# Patient Record
Sex: Female | Born: 1995 | Race: White | Hispanic: No | Marital: Single | Smoking: Never smoker
Health system: Southern US, Community
[De-identification: ages and names within clinical notes are randomized; demographics above are authoritative.]

## PROBLEM LIST (undated history)

## (undated) DIAGNOSIS — F329 Major depressive disorder, single episode, unspecified: Secondary | ICD-10-CM

## (undated) DIAGNOSIS — F32A Depression, unspecified: Secondary | ICD-10-CM

## (undated) HISTORY — PX: ANKLE SURGERY: SHX546

---

## 2014-09-20 ENCOUNTER — Emergency Department: Payer: Self-pay | Admitting: Emergency Medicine

## 2014-09-21 LAB — BASIC METABOLIC PANEL
Anion Gap: 7 (ref 7–16)
BUN: 14 mg/dL (ref 7–18)
CREATININE: 1.03 mg/dL (ref 0.60–1.30)
Calcium, Total: 9.5 mg/dL (ref 9.0–10.7)
Chloride: 105 mmol/L (ref 98–107)
Co2: 28 mmol/L (ref 21–32)
EGFR (African American): >60
EGFR (Non-African Amer.): >60
GLUCOSE: 94 mg/dL (ref 65–99)
Osmolality: 280 (ref 275–301)
Potassium: 3.7 mmol/L (ref 3.5–5.1)
SODIUM: 140 mmol/L (ref 136–145)

## 2014-09-21 LAB — CBC
HCT: 42.2 % (ref 35.0–47.0)
HGB: 14 g/dL (ref 12.0–16.0)
MCH: 27.4 pg (ref 26.0–34.0)
MCHC: 33.1 g/dL (ref 32.0–36.0)
MCV: 83 fL (ref 80–100)
Platelet: 385 10*3/uL (ref 150–440)
RBC: 5.1 10*6/uL (ref 3.80–5.20)
RDW: 13.1 % (ref 11.5–14.5)
WBC: 9.4 10*3/uL (ref 3.6–11.0)

## 2015-12-26 ENCOUNTER — Encounter: Payer: Self-pay | Admitting: Emergency Medicine

## 2015-12-26 ENCOUNTER — Emergency Department
Admission: EM | Admit: 2015-12-26 | Discharge: 2015-12-27 | Disposition: A | Discharge status: Home / Self Care | Payer: BLUE CROSS/BLUE SHIELD | Attending: Emergency Medicine | Admitting: Emergency Medicine

## 2015-12-26 DIAGNOSIS — R112 Nausea with vomiting, unspecified: Secondary | ICD-10-CM | POA: Insufficient documentation

## 2015-12-26 DIAGNOSIS — R109 Unspecified abdominal pain: Secondary | ICD-10-CM | POA: Diagnosis not present

## 2015-12-26 DIAGNOSIS — Z79899 Other long term (current) drug therapy: Secondary | ICD-10-CM | POA: Insufficient documentation

## 2015-12-26 DIAGNOSIS — R197 Diarrhea, unspecified: Secondary | ICD-10-CM | POA: Diagnosis not present

## 2015-12-26 DIAGNOSIS — R0602 Shortness of breath: Secondary | ICD-10-CM | POA: Insufficient documentation

## 2015-12-26 DIAGNOSIS — Z792 Long term (current) use of antibiotics: Secondary | ICD-10-CM | POA: Insufficient documentation

## 2015-12-26 DIAGNOSIS — R1110 Vomiting, unspecified: Secondary | ICD-10-CM

## 2015-12-26 DIAGNOSIS — R42 Dizziness and giddiness: Secondary | ICD-10-CM | POA: Insufficient documentation

## 2015-12-26 DIAGNOSIS — R55 Syncope and collapse: Secondary | ICD-10-CM | POA: Diagnosis present

## 2015-12-26 DIAGNOSIS — E860 Dehydration: Secondary | ICD-10-CM | POA: Diagnosis not present

## 2015-12-26 DIAGNOSIS — R000 Tachycardia, unspecified: Secondary | ICD-10-CM | POA: Diagnosis not present

## 2015-12-26 DIAGNOSIS — Z3202 Encounter for pregnancy test, result negative: Secondary | ICD-10-CM | POA: Diagnosis not present

## 2015-12-26 LAB — COMPREHENSIVE METABOLIC PANEL
ALK PHOS: 66 U/L (ref 38–126)
ALT: 43 U/L (ref 14–54)
ANION GAP: 9 (ref 5–15)
AST: 27 U/L (ref 15–41)
Albumin: 4.5 g/dL (ref 3.5–5.0)
BILIRUBIN TOTAL: 1.1 mg/dL (ref 0.3–1.2)
BUN: 13 mg/dL (ref 6–20)
CALCIUM: 9.7 mg/dL (ref 8.9–10.3)
CO2: 26 mmol/L (ref 22–32)
CREATININE: 0.83 mg/dL (ref 0.44–1.00)
Chloride: 102 mmol/L (ref 101–111)
GFR calc non Af Amer: >60 mL/min (ref >60)
Glucose, Bld: 106 mg/dL — ABNORMAL HIGH (ref 65–99)
Potassium: 3.9 mmol/L (ref 3.5–5.1)
Sodium: 137 mmol/L (ref 135–145)
TOTAL PROTEIN: 7.9 g/dL (ref 6.5–8.1)

## 2015-12-26 LAB — CBC
HCT: 43.2 % (ref 35.0–47.0)
HEMOGLOBIN: 14.2 g/dL (ref 12.0–16.0)
MCH: 26.1 pg (ref 26.0–34.0)
MCHC: 32.8 g/dL (ref 32.0–36.0)
MCV: 79.6 fL — ABNORMAL LOW (ref 80.0–100.0)
PLATELETS: 381 10*3/uL (ref 150–440)
RBC: 5.42 MIL/uL — AB (ref 3.80–5.20)
RDW: 14.5 % (ref 11.5–14.5)
WBC: 12.4 10*3/uL — ABNORMAL HIGH (ref 3.6–11.0)

## 2015-12-26 LAB — LIPASE, BLOOD: Lipase: 20 U/L (ref 11–51)

## 2015-12-26 MED ORDER — ONDANSETRON 4 MG PO TBDP
4.0000 mg | ORAL_TABLET | Freq: Once | ORAL | Status: AC | PRN
  Administered 2015-12-26: 4 mg via ORAL
  Filled 2015-12-26: qty 1

## 2015-12-26 MED ORDER — SODIUM CHLORIDE 0.9 % IV BOLUS (SEPSIS)
1000.0000 mL | Freq: Once | INTRAVENOUS | Status: AC
  Administered 2015-12-26: 1000 mL via INTRAVENOUS

## 2015-12-26 NOTE — ED Notes (Signed)
Pt presents to ED with frequent vomiting and diarrhea for the past 24 hours with generalized abd "discomfort". Pt states the last time she went to the bathroom approx 1 hour ago to vomit she had a syncopal episode. Reports hitting her head on the toilet. Pt is alert and calm at this time. Pt reports she still feels dizzy. Has been unable to keep down any solids or fluids today.

## 2015-12-26 NOTE — ED Notes (Signed)
Pt reports low abd pain with n/v/d since last night.  No vag bleeding.  No dysuria.  Pt states vomited x 5 today.  Decreased appetite today.  Pt alert. Speech clear.

## 2015-12-27 ENCOUNTER — Emergency Department: Payer: BLUE CROSS/BLUE SHIELD

## 2015-12-27 LAB — URINALYSIS COMPLETE WITH MICROSCOPIC (ARMC ONLY)
BILIRUBIN URINE: NEGATIVE
Glucose, UA: NEGATIVE mg/dL
HGB URINE DIPSTICK: NEGATIVE
Nitrite: NEGATIVE
PH: 6 (ref 5.0–8.0)
Protein, ur: 30 mg/dL — AB
SPECIFIC GRAVITY, URINE: 1.03 (ref 1.005–1.030)

## 2015-12-27 LAB — POCT PREGNANCY, URINE: Preg Test, Ur: NEGATIVE

## 2015-12-27 MED ORDER — ONDANSETRON 4 MG PO TBDP: 4.0000 mg | ORAL_TABLET | Freq: Three times a day (TID) | ORAL | Status: AC | PRN

## 2015-12-27 NOTE — ED Notes (Signed)
Pt verbalizes understanding of discharge instructions.

## 2015-12-27 NOTE — ED Notes (Signed)
Pt return from ct scan 

## 2015-12-27 NOTE — Discharge Instructions (Signed)
Diarrhea °Diarrhea is frequent loose and watery bowel movements. It can cause you to feel weak and dehydrated. Dehydration can cause you to become tired and thirsty, have a dry mouth, and have decreased urination that often is dark yellow. Diarrhea is a sign of another problem, most often an infection that will not last long. In most cases, diarrhea typically lasts 2-3 days. However, it can last longer if it is a sign of something more serious. It is important to treat your diarrhea as directed by your caregiver to lessen or prevent future episodes of diarrhea. °CAUSES  °Some common causes include: °· Gastrointestinal infections caused by viruses, bacteria, or parasites. °· Food poisoning or food allergies. °· Certain medicines, such as antibiotics, chemotherapy, and laxatives. °· Artificial sweeteners and fructose. °· Digestive disorders. °HOME CARE INSTRUCTIONS °· Ensure adequate fluid intake (hydration): Have 1 cup (8 oz) of fluid for each diarrhea episode. Avoid fluids that contain simple sugars or sports drinks, fruit juices, whole milk products, and sodas. Your urine should be clear or pale yellow if you are drinking enough fluids. Hydrate with an oral rehydration solution that you can purchase at pharmacies, retail stores, and online. You can prepare an oral rehydration solution at home by mixing the following ingredients together: °·  - tsp table salt. °· ¾ tsp baking soda. °·  tsp salt substitute containing potassium chloride. °· 1  tablespoons sugar. °· 1 L (34 oz) of water. °· Certain foods and beverages may increase the speed at which food moves through the gastrointestinal (GI) tract. These foods and beverages should be avoided and include: °· Caffeinated and alcoholic beverages. °· High-fiber foods, such as raw fruits and vegetables, nuts, seeds, and whole grain breads and cereals. °· Foods and beverages sweetened with sugar alcohols, such as xylitol, sorbitol, and mannitol. °· Some foods may be well  tolerated and may help thicken stool including: °· Starchy foods, such as rice, toast, pasta, low-sugar cereal, oatmeal, grits, baked potatoes, crackers, and bagels. °· Bananas. °· Applesauce. °· Add probiotic-rich foods to help increase healthy bacteria in the GI tract, such as yogurt and fermented milk products. °· Wash your hands well after each diarrhea episode. °· Only take over-the-counter or prescription medicines as directed by your caregiver. °· Take a warm bath to relieve any burning or pain from frequent diarrhea episodes. °SEEK IMMEDIATE MEDICAL CARE IF:  °· You are unable to keep fluids down. °· You have persistent vomiting. °· You have blood in your stool, or your stools are black and tarry. °· You do not urinate in 6-8 hours, or there is only a small amount of very dark urine. °· You have abdominal pain that increases or localizes. °· You have weakness, dizziness, confusion, or light-headedness. °· You have a severe headache. °· Your diarrhea gets worse or does not get better. °· You have a fever or persistent symptoms for more than 2-3 days. °· You have a fever and your symptoms suddenly get worse. °MAKE SURE YOU:  °· Understand these instructions. °· Will watch your condition. °· Will get help right away if you are not doing well or get worse. °  °This information is not intended to replace advice given to you by your health care provider. Make sure you discuss any questions you have with your health care provider. °  °Document Released: 09/28/2002 Document Revised: 10/29/2014 Document Reviewed: 06/15/2012 °Elsevier Interactive Patient Education ©2016 Else[employer] Inc[employer] ° °Nausea and Vomiting °Nausea is a sick feeling that often comes   before throwing up (vomiting). Vomiting is a reflex where stomach contents come out of your mouth. Vomiting can cause severe loss of body fluids (dehydration). Children and elderly adults can become dehydrated quickly, especially if they also have diarrhea. Nausea and  vomiting are symptoms of a condition or disease. It is important to find the cause of your symptoms. CAUSES   Direct irritation of the stomach lining. This irritation can result from increased acid production (gastroesophageal reflux disease), infection, food poisoning, taking certain medicines (such as nonsteroidal anti-inflammatory drugs), alcohol use, or tobacco use.  Signals from the brain.These signals could be caused by a headache, heat exposure, an inner ear disturbance, increased pressure in the brain from injury, infection, a tumor, or a concussion, pain, emotional stimulus, or metabolic problems.  An obstruction in the gastrointestinal tract (bowel obstruction).  Illnesses such as diabetes, hepatitis, gallbladder problems, appendicitis, kidney problems, cancer, sepsis, atypical symptoms of a heart attack, or eating disorders.  Medical treatments such as chemotherapy and radiation.  Receiving medicine that makes you sleep (general anesthetic) during surgery. DIAGNOSIS Your caregiver may ask for tests to be done if the problems do not improve after a few days. Tests may also be done if symptoms are severe or if the reason for the nausea and vomiting is not clear. Tests may include:  Urine tests.  Blood tests.  Stool tests.  Cultures (to look for evidence of infection).  X-rays or other imaging studies. Test results can help your caregiver make decisions about treatment or the need for additional tests. TREATMENT You need to stay well hydrated. Drink frequently but in small amounts.You may wish to drink water, sports drinks, clear broth, or eat frozen ice pops or gelatin dessert to help stay hydrated.When you eat, eating slowly may help prevent nausea.There are also some antinausea medicines that may help prevent nausea. HOME CARE INSTRUCTIONS   Take all medicine as directed by your caregiver.  If you do not have an appetite, do not force yourself to eat. However, you must  continue to drink fluids.  If you have an appetite, eat a normal diet unless your caregiver tells you differently.  Eat a variety of complex carbohydrates (rice, wheat, potatoes, bread), lean meats, yogurt, fruits, and vegetables.  Avoid high-fat foods because they are more difficult to digest.  Drink enough water and fluids to keep your urine clear or pale yellow.  If you are dehydrated, ask your caregiver for specific rehydration instructions. Signs of dehydration may include:  Severe thirst.  Dry lips and mouth.  Dizziness.  Dark urine.  Decreasing urine frequency and amount.  Confusion.  Rapid breathing or pulse. SEEK IMMEDIATE MEDICAL CARE IF:   You have blood or brown flecks (like coffee grounds) in your vomit.  You have black or bloody stools.  You have a severe headache or stiff neck.  You are confused.  You have severe abdominal pain.  You have chest pain or trouble breathing.  You do not urinate at least once every 8 hours.  You develop cold or clammy skin.  You continue to vomit for longer than 24 to 48 hours.  You have a fever. MAKE SURE YOU:   Understand these instructions.  Will watch your condition.  Will get help right away if you are not doing well or get worse.   This information is not intended to replace advice given to you by your health care provider. Make sure you discuss any questions you have with your health care  provider.   Document Released: 10/08/2005 Document Revised: 12/31/2011 Document Reviewed: 03/07/2011 Elsevier Interactive Patient Education 2016 Elsevier Inc.  Dehydration, Adult Dehydration is a condition in which you do not have enough fluid or water in your body. It happens when you take in less fluid than you lose. Vital organs such as the kidneys, brain, and heart cannot function without a proper amount of fluids. Any loss of fluids from the body can cause dehydration.  Dehydration can range from mild to severe.  This condition should be treated right away to help prevent it from becoming severe. CAUSES  This condition may be caused by:  Vomiting.  Diarrhea.  Excessive sweating, such as when exercising in hot or humid weather.  Not drinking enough fluid during strenuous exercise or during an illness.  Excessive urine output.  Fever.  Certain medicines. RISK FACTORS This condition is more likely to develop in:  People who are taking certain medicines that cause the body to lose excess fluid (diuretics).   People who have a chronic illness, such as diabetes, that may increase urination.  Older adults.   People who live at high altitudes.   People who participate in endurance sports.  SYMPTOMS  Mild Dehydration  Thirst.  Dry lips.  Slightly dry mouth.  Dry, warm skin. Moderate Dehydration  Very dry mouth.   Muscle cramps.   Dark urine and decreased urine production.   Decreased tear production.   Headache.   Light-headedness, especially when you stand up from a sitting position.  Severe Dehydration  Changes in skin.   Cold and clammy skin.   Skin does not spring back quickly when lightly pinched and released.   Changes in body fluids.   Extreme thirst.   No tears.   Not able to sweat when body temperature is high, such as in hot weather.   Minimal urine production.   Changes in vital signs.   Rapid, weak pulse (more than 100 beats per minute when you are sitting still).   Rapid breathing.   Low blood pressure.   Other changes.   Sunken eyes.   Cold hands and feet.   Confusion.  Lethargy and difficulty being awakened.  Fainting (syncope).   Short-term weight loss.   Unconsciousness. DIAGNOSIS  This condition may be diagnosed based on your symptoms. You may also have tests to determine how severe your dehydration is. These tests may include:   Urine tests.   Blood tests.  TREATMENT  Treatment for this  condition depends on the severity. Mild or moderate dehydration can often be treated at home. Treatment should be started right away. Do not wait until dehydration becomes severe. Severe dehydration needs to be treated at the hospital. Treatment for Mild Dehydration  Drinking plenty of water to replace the fluid you have lost.   Replacing minerals in your blood (electrolytes) that you may have lost.  Treatment for Moderate Dehydration  Consuming oral rehydration solution (ORS). Treatment for Severe Dehydration  Receiving fluid through an IV tube.   Receiving electrolyte solution through a feeding tube that is passed through your nose and into your stomach (nasogastric tube or NG tube).  Correcting any abnormalities in electrolytes. HOME CARE INSTRUCTIONS   Drink enough fluid to keep your urine clear or pale yellow.   Drink water or fluid slowly by taking small sips. You can also try sucking on ice cubes.  Have food or beverages that contain electrolytes. Examples include bananas and sports drinks.  Take over-the-counter and  prescription medicines only as told by your health care provider.   Prepare ORS according to the manufacturer's instructions. Take sips of ORS every 5 minutes until your urine returns to normal.  If you have vomiting or diarrhea, continue to try to drink water, ORS, or both.   If you have diarrhea, avoid:   Beverages that contain caffeine.   Fruit juice.   Milk.   Carbonated soft drinks.  Do not take salt tablets. This can lead to the condition of having too much sodium in your body (hypernatremia).  SEEK MEDICAL CARE IF:  You cannot eat or drink without vomiting.  You have had moderate diarrhea during a period of more than 24 hours.  You have a fever. SEEK IMMEDIATE MEDICAL CARE IF:   You have extreme thirst.  You have severe diarrhea.  You have not urinated in 6-8 hours, or you have urinated only a small amount of very dark  urine.  You have shriveled skin.  You are dizzy, confused, or both.   This information is not intended to replace advice given to you by your health care provider. Make sure you discuss any questions you have with your health care provider.   Document Released: 10/08/2005 Document Revised: 06/29/2015 Document Reviewed: 02/23/2015 Elsevier Interactive Patient Education 2016 [employer]  Syncope Syncope is a medical term for fainting or passing out. This means you lose consciousness and drop to the ground. People are generally unconscious for less than 5 minutes. You may have some muscle twitches for up to 15 seconds before waking up and returning to normal. Syncope occurs more often in older adults, but it can happen to anyone. While most causes of syncope are not dangerous, syncope can be a sign of a serious medical problem. It is important to seek medical care.  CAUSES  Syncope is caused by a sudden drop in blood flow to the brain. The specific cause is often not determined. Factors that can bring on syncope include:  Taking medicines that lower blood pressure.  Sudden changes in posture, such as standing up quickly.  Taking more medicine than prescribed.  Standing in one place for too long.  Seizure disorders.  Dehydration and excessive exposure to heat.  Low blood sugar (hypoglycemia).  Straining to have a bowel movement.  Heart disease, irregular heartbeat, or other circulatory problems.  Fear, emotional distress, seeing blood, or severe pain. SYMPTOMS  Right before fainting, you may:  Feel dizzy or light-headed.  Feel nauseous.  See all white or all black in your field of vision.  Have cold, clammy skin. DIAGNOSIS  Your health care provider will ask about your symptoms, perform a physical exam, and perform an electrocardiogram (ECG) to record the electrical activity of your heart. Your health care provider may also perform other heart or blood tests to determine  the cause of your syncope which may include:  Transthoracic echocardiogram (TTE). During echocardiography, sound waves are used to evaluate how blood flows through your heart.  Transesophageal echocardiogram (TEE).  Cardiac monitoring. This allows your health care provider to monitor your heart rate and rhythm in real time.  Holter monitor. This is a portable device that records your heartbeat and can help diagnose heart arrhythmias. It allows your health care provider to track your heart activity for several days, if needed.  Stress tests by exercise or by giving medicine that makes the heart beat faster. TREATMENT  In most cases, no treatment is needed. Depending on the cause of your  syncope, your health care provider may recommend changing or stopping some of your medicines. HOME CARE INSTRUCTIONS  Have someone stay with you until you feel stable.  Do not drive, use machinery, or play sports until your health care provider says it is okay.  Keep all follow-up appointments as directed by your health care provider.  Lie down right away if you start feeling like you might faint. Breathe deeply and steadily. Wait until all the symptoms have passed.  Drink enough fluids to keep your urine clear or pale yellow.  If you are taking blood pressure or heart medicine, get up slowly and take several minutes to sit and then stand. This can reduce dizziness. SEEK IMMEDIATE MEDICAL CARE IF:   You have a severe headache.  You have unusual pain in the chest, abdomen, or back.  You are bleeding from your mouth or rectum, or you have black or tarry stool.  You have an irregular or very fast heartbeat.  You have pain with breathing.  You have repeated fainting or seizure-like jerking during an episode.  You faint when sitting or lying down.  You have confusion.  You have trouble walking.  You have severe weakness.  You have vision problems. If you fainted, call your local emergency  services (911 in [place] Do not drive yourself to the hospital.    This information is not intended to replace advice given to you by your health care provider. Make sure you discuss any questions you have with your health care provider.   Document Released: 10/08/2005 Document Revised: 02/22/2015 Document Reviewed: 12/07/2011 Elsevier Interactive Patient Education [employer]

## 2015-12-27 NOTE — ED Provider Notes (Signed)
[facility] Emergency Department Provider Note  ____________________________________________  Time seen: Approximately 2346   I have reviewed the triage vital signs and the nursing notes.   HISTORY  Chief Complaint Emesis; Diarrhea; and Loss of Consciousness    HPI Christine Blevins is a 20 y.o. female who comes into the hospital today with vomiting and diarrhea for the past 24 hours. The patient reports that she has been unable to walk and felt dizzy and shaky. She reports that she was getting up to vomit again she passed out tonight. She hit her head on the toilet. She's had about 7-8 episodes of vomiting and has had diarrhea every 10 minutes. She has been unable to keep anything down. She has some mild abdominal discomfort she reports a 4 out of 10 in intensity. The patient has had some shortness of breath but reports it was worse earlier. The patient is a Consulting civil engineer at [employer] reports that everyone is sick. She did have the flu 3 weeks ago but had recovered and doing well.She reports that she is unsure exactly how long she was out for after her syncopal event but it was not a long time.   History reviewed. No pertinent past medical history.  There are no active problems to display for this patient.   Past Surgical History  Procedure Laterality Date  . Ankle surgery      Current Outpatient Rx  Name  Route  Sig  Dispense  Refill  . metroNIDAZOLE (FLAGYL) 500 MG tablet   Oral   Take 1 tablet by mouth 2 (two) times daily.      0   . OXcarbazepine (TRILEPTAL) 150 MG tablet   Oral   Take 1 tablet by mouth daily.      0   . traZODone (DESYREL) 100 MG tablet   Oral   Take 200 mg by mouth at bedtime.      0   . venlafaxine XR (EFFEXOR-XR) 150 MG 24 hr capsule   Oral   Take 150 mg by mouth daily.      0   . venlafaxine XR (EFFEXOR-XR) 37.5 MG 24 hr capsule   Oral   Take 37.5 mg by mouth daily.      0   . ondansetron (ZOFRAN ODT) 4 MG  disintegrating tablet   Oral   Take 1 tablet (4 mg total) by mouth every 8 (eight) hours as needed for nausea or vomiting.   20 tablet   0     Allergies Review of patient's allergies indicates no known allergies.  No family history on file.  Social History Social History  Substance Use Topics  . Smoking status: Never Smoker   . Smokeless tobacco: None  . Alcohol Use: Yes    Review of Systems Constitutional: No fever/chills Eyes: No visual changes. ENT: No sore throat. Cardiovascular: Denies chest pain. Respiratory:  shortness of breath. Gastrointestinal: Abdominal pain, nausea, vomiting, diarrhea Genitourinary: Negative for dysuria. Musculoskeletal: Negative for back pain. Skin: Negative for rash. Neurological: Dizziness and syncope  10-point ROS otherwise negative.  ____________________________________________   PHYSICAL EXAM:  VITAL SIGNS: ED Triage Vitals  Enc Vitals Group     BP 12/26/15 2026 129/75 mmHg     Pulse Rate 12/26/15 2026 113     Resp 12/26/15 2026 20     Temp 12/26/15 2026 99.6 F (37.6 C)     Temp Source 12/26/15 2026 Oral     SpO2 12/26/15 2026 100 %  Weight 12/26/15 2026 140 lb (63.504 kg)     Height 12/26/15 2026  (1.575 m)     Head Cir --      Peak Flow --      Pain Score 12/26/15 2031 4     Pain Loc --      Pain Edu? --      Excl. in GC? --     Constitutional: Alert and oriented. Well appearing and in mild distress. Eyes: Conjunctivae are normal. PERRL. EOMI. Head: Atraumatic. Nose: No congestion/rhinnorhea. Mouth/Throat: Mucous membranes are moist.  Oropharynx non-erythematous. Cardiovascular: Tachycardia, regular rhythm. Grossly normal heart sounds.  Good peripheral circulation. Respiratory: Normal respiratory effort.  No retractions. Lungs CTAB. Gastrointestinal: Soft and nontender. No distention. Positive bowel sounds Musculoskeletal: No lower extremity tenderness nor edema.   Neurologic:  Normal speech and  language.  Skin:  Skin is warm, dry and intact.  Psychiatric: Mood and affect are normal.   ____________________________________________   LABS (all labs ordered are listed, but only abnormal results are displayed)  Labs Reviewed  COMPREHENSIVE METABOLIC PANEL - Abnormal; Notable for the following:    Glucose, Bld 106 (*)    All other components within normal limits  CBC - Abnormal; Notable for the following:    WBC 12.4 (*)    RBC 5.42 (*)    MCV 79.6 (*)    All other components within normal limits  URINALYSIS COMPLETEWITH MICROSCOPIC ([facility] ONLY) - Abnormal; Notable for the following:    Color, Urine AMBER (*)    APPearance CLOUDY (*)    Ketones, ur 2+ (*)    Protein, ur 30 (*)    Leukocytes, UA TRACE (*)    Bacteria, UA FEW (*)    Squamous Epithelial / LPF 6-30 (*)    All other components within normal limits  LIPASE, BLOOD  POC URINE PREG, ED  POCT PREGNANCY, URINE   ____________________________________________  EKG  ED ECG REPORT I, Rebecka Apley, the attending physician, personally viewed and interpreted this ECG.   Date: 12/26/2015  EKG Time: 2041  Rate: 107  Rhythm: sinus tachycardia  Axis: Normal  Intervals:none  ST&T Change: None  ____________________________________________  RADIOLOGY  CT scan: Negative head CT with no acute intracranial process identified ____________________________________________   PROCEDURES  Procedure(s) performed: None  Critical Care performed: No  ____________________________________________   INITIAL IMPRESSION / ASSESSMENT AND PLAN / ED COURSE  Pertinent labs & imaging results that were available during my care of the patient were reviewed by me and considered in my medical decision making (see chart for details).  This is a 20 year old female who comes into the hospital today with vomiting diarrhea dizziness and a syncopal episode. I feel the patient may be dehydrated. Orthostatics unremarkable but her  heart rate did go up 15 beats from laying to standing. I did give the patient a liter of normal saline and she reports that she was feeling improved. I also gave the patient is a Zofran. She was able to drink some ginger ale after the Zofran without any further vomiting. At this time I feel that the patient be discharged to home. She is having improvement in her symptoms and is keeping down fluids. I will have the [facility]-2 days to ensure that she is improving. The patient has no further complaints or concerns and she'll be discharged home. ____________________________________________   FINAL CLINICAL IMPRESSION(S) / ED DIAGNOSES  Final diagnoses:  Vomiting and  diarrhea  Syncope, unspecified syncope type  Dehydration      Rebecka ApleyAllison P Lexani Corona, MD 12/27/15 [phone]

## 2015-12-27 NOTE — ED Notes (Signed)
Pt states she is feeling better .

## 2015-12-27 NOTE — ED Notes (Signed)
Pt drinking po challenge.

## 2016-02-08 IMAGING — CT CT HEAD WITHOUT CONTRAST
1 series · 16 of 30 positions shown, 20 images · non-contrast
Comparison: None.

CLINICAL DATA: Chronic right-sided headache for 2 months. Initial
encounter.

EXAM:
CT HEAD WITHOUT CONTRAST
TECHNIQUE: Contiguous axial images were obtained from the base of the skull
through the vertex without intravenous contrast.

[Series 2: head wo · axial · 0.40mm/px · z∈[-128,-2]mm · 16 of 32 slices shown, 20 images]
[im 2/32  brain]
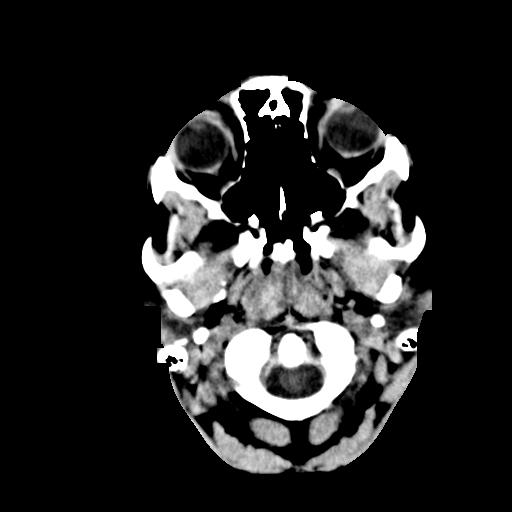
[im 2/32  bone]
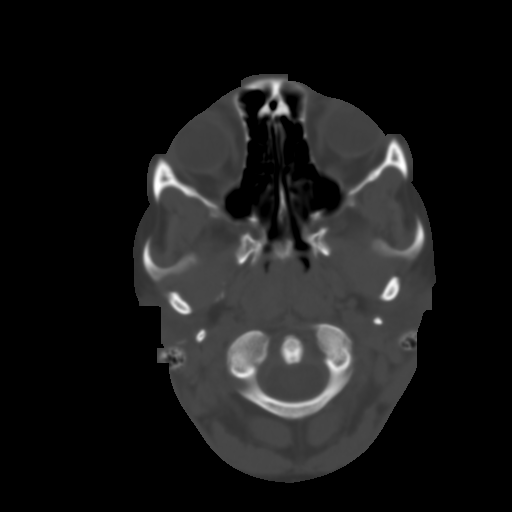
[im 4/32  brain]
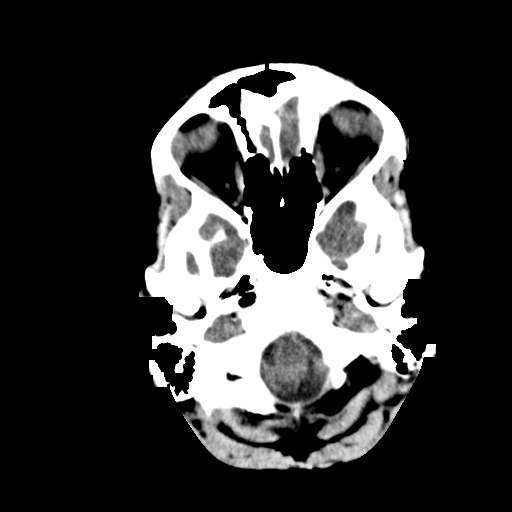
[im 6/32  brain]
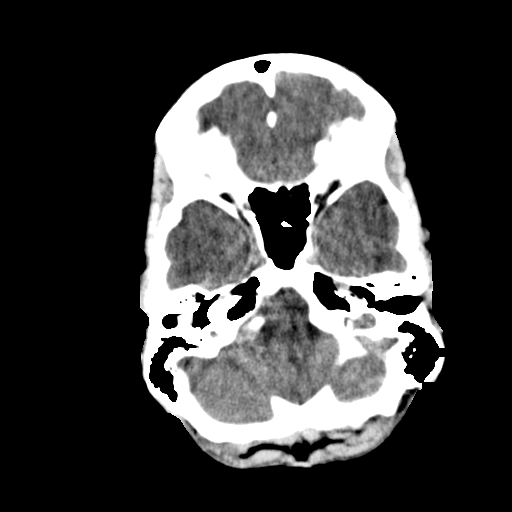
[im 8/32  brain]
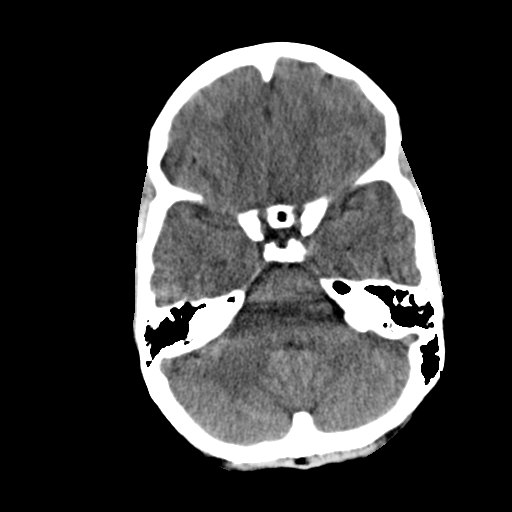
[im 9/32  brain]
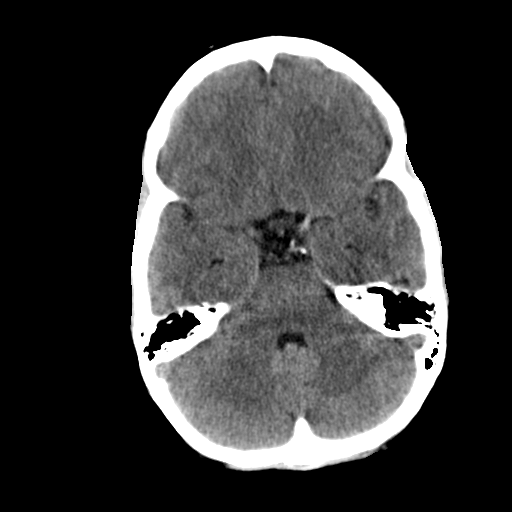
[im 9/32  bone]
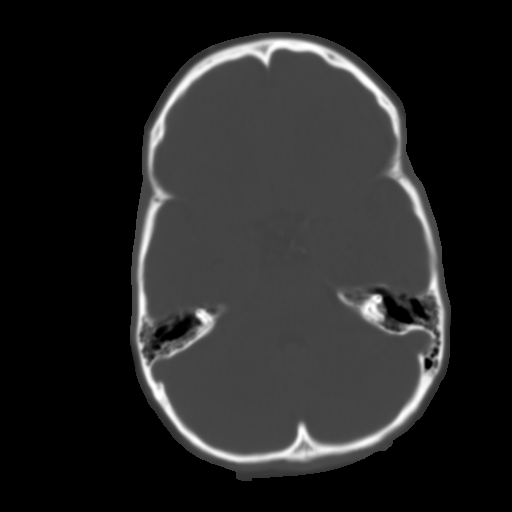
[im 11/32  brain]
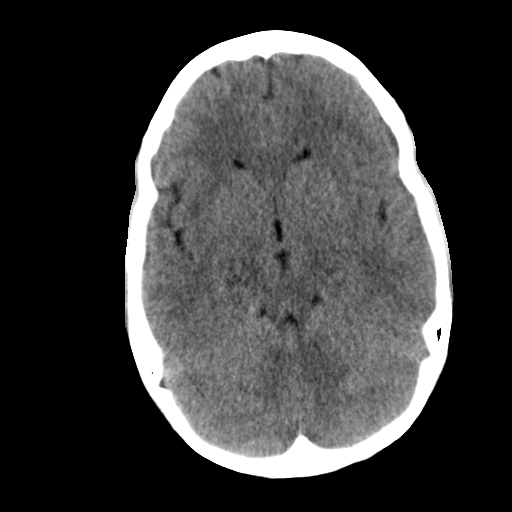
[im 13/32  brain]
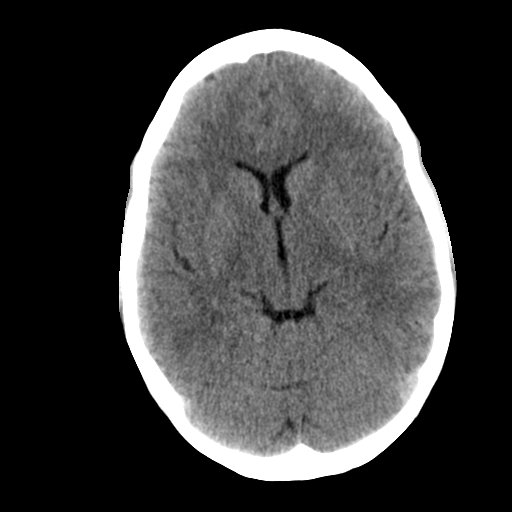
[im 15/32  brain]
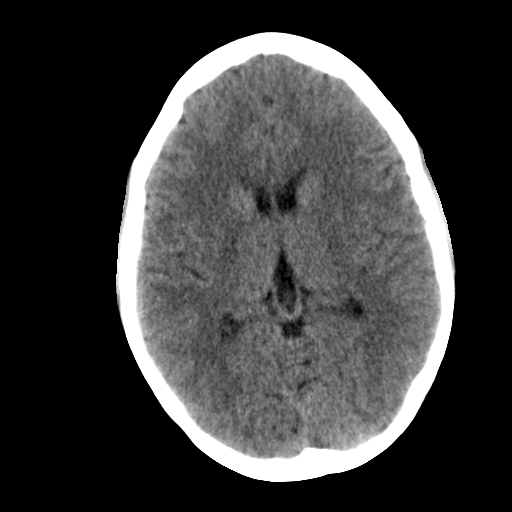
[im 17/32  brain]
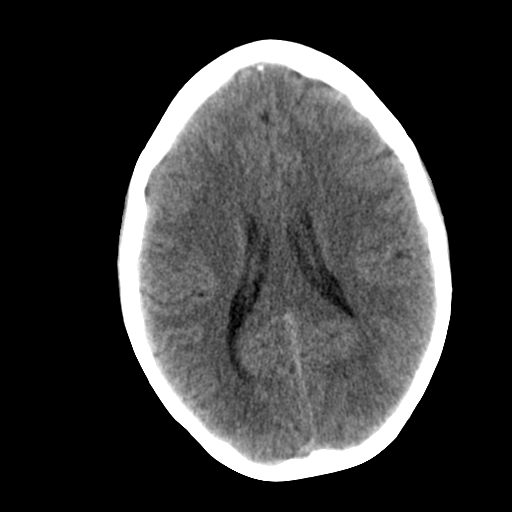
[im 17/32  bone]
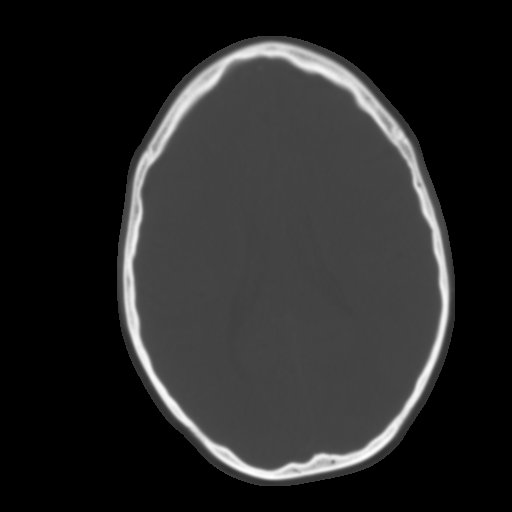
[im 19/32  brain]
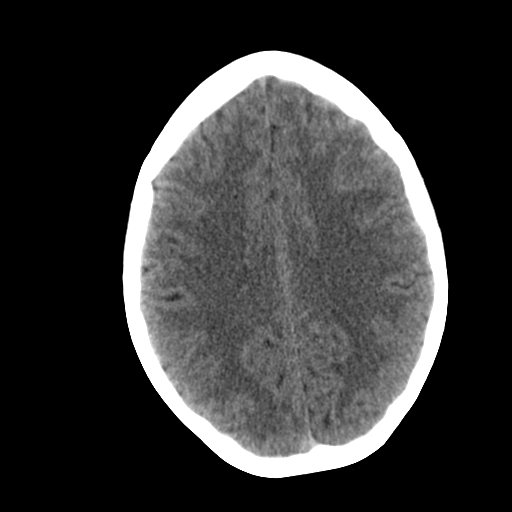
[im 21/32  brain]
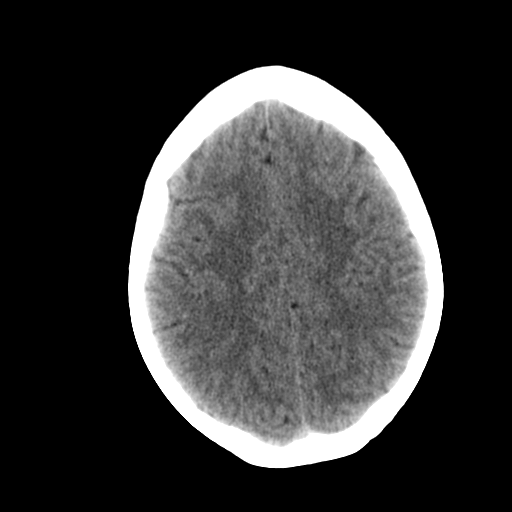
[im 23/32  brain]
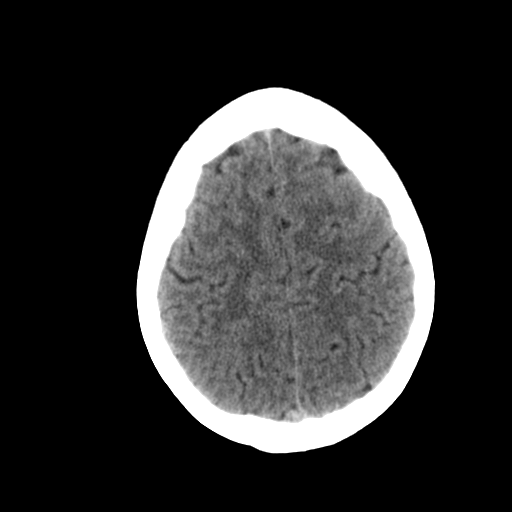
[im 24/32  brain]
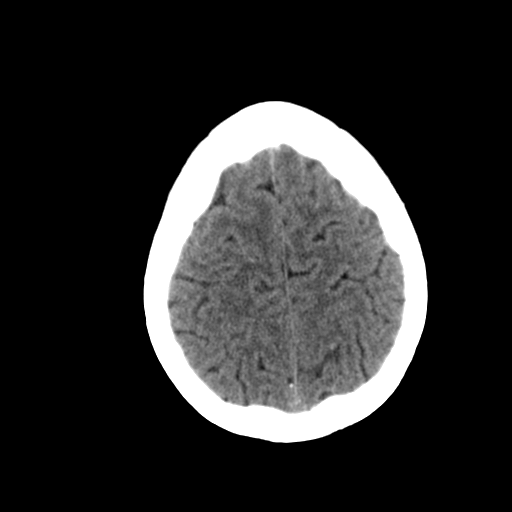
[im 24/32  bone]
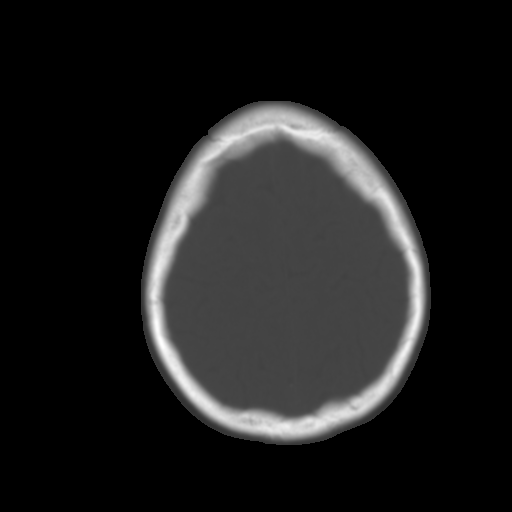
[im 26/32  brain]
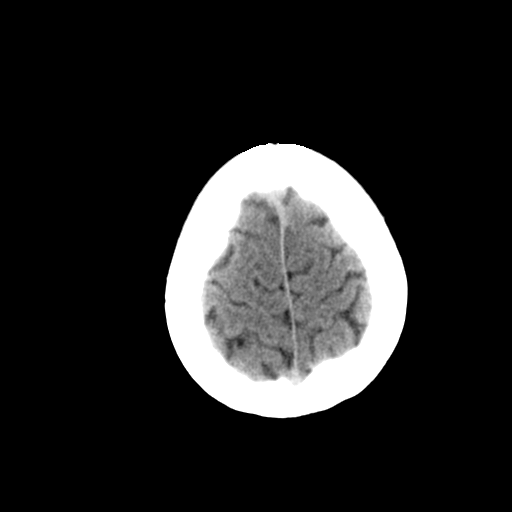
[im 28/32  brain]
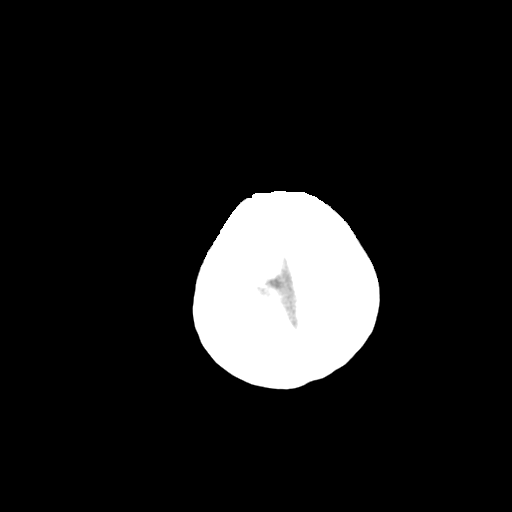
[im 30/32  brain]
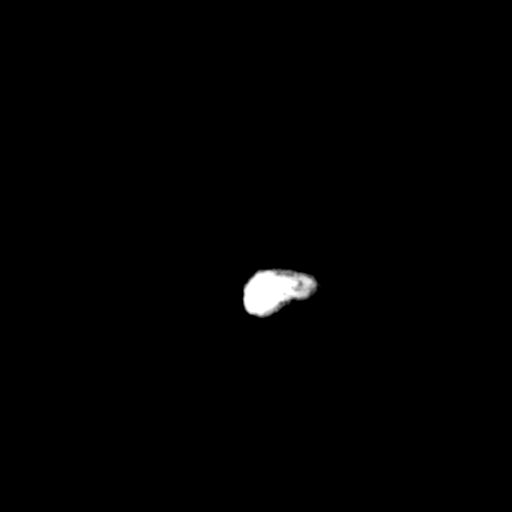

[16 of 30 positions shown; findings below may reference images not displayed]

FINDINGS: There is no evidence of acute infarction, mass lesion, or intra- or
extra-axial hemorrhage on CT.

The posterior fossa, including the cerebellum, brainstem and fourth
ventricle, is within normal limits. The third and lateral
ventricles, and basal ganglia are unremarkable in appearance. The
cerebral hemispheres are symmetric in appearance, with normal
gray-white differentiation. No mass effect or midline shift is seen.

There is no evidence of fracture; visualized osseous structures are
unremarkable in appearance. The visualized portions of the orbits
are within normal limits. The paranasal sinuses and mastoid air
cells are well-aerated. No significant soft tissue abnormalities are
seen.
IMPRESSION: Unremarkable noncontrast CT of the head.

## 2016-07-06 ENCOUNTER — Encounter: Payer: Self-pay | Admitting: Emergency Medicine

## 2016-07-06 ENCOUNTER — Emergency Department
Admission: EM | Admit: 2016-07-06 | Discharge: 2016-07-06 | Disposition: A | Discharge status: Home / Self Care | Payer: BLUE CROSS/BLUE SHIELD | Attending: Emergency Medicine | Admitting: Emergency Medicine

## 2016-07-06 ENCOUNTER — Ambulatory Visit
Admission: EM | Admit: 2016-07-06 | Discharge: 2016-07-06 | Disposition: A | Discharge status: Home / Self Care | Payer: No Typology Code available for payment source | Source: Ambulatory Visit | Attending: Emergency Medicine | Admitting: Emergency Medicine

## 2016-07-06 DIAGNOSIS — Z0441 Encounter for examination and observation following alleged adult rape: Secondary | ICD-10-CM | POA: Diagnosis present

## 2016-07-06 DIAGNOSIS — IMO0002 Reserved for concepts with insufficient information to code with codable children: Secondary | ICD-10-CM

## 2016-07-06 MED ORDER — LIDOCAINE HCL (PF) 1 % IJ SOLN
INTRAMUSCULAR | Status: AC
  Administered 2016-07-06: 0.9 mL
  Filled 2016-07-06: qty 5

## 2016-07-06 MED ORDER — AZITHROMYCIN 500 MG PO TABS
ORAL_TABLET | ORAL | Status: AC
  Administered 2016-07-06: 1000 mg
  Filled 2016-07-06: qty 2

## 2016-07-06 MED ORDER — METRONIDAZOLE 500 MG PO TABS
2000.0000 mg | ORAL_TABLET | Freq: Once | ORAL | Status: AC
  Administered 2016-07-06: 2000 mg via ORAL

## 2016-07-06 MED ORDER — CEFTRIAXONE SODIUM 250 MG IJ SOLR
INTRAMUSCULAR | Status: AC
  Administered 2016-07-06: 250 mg
  Filled 2016-07-06: qty 250

## 2016-07-06 MED ORDER — PROMETHAZINE HCL 25 MG PO TABS
25.0000 mg | ORAL_TABLET | Freq: Four times a day (QID) | ORAL | Status: DC | PRN
  Administered 2016-07-06: 75 mg via ORAL

## 2016-07-06 MED ORDER — ULIPRISTAL ACETATE 30 MG PO TABS
ORAL_TABLET | ORAL | Status: AC
  Administered 2016-07-06: 30 mg
  Filled 2016-07-06: qty 1
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
## 2016-07-06 NOTE — ED Notes (Signed)
HR [id number]. MD aware.

## 2016-07-09 LAB — POCT PREGNANCY, URINE: PREG TEST UR: NEGATIVE

## 2016-08-09 ENCOUNTER — Ambulatory Visit (HOSPITAL_COMMUNITY)
Admission: EM | Admit: 2016-08-09 | Discharge: 2016-08-09 | Disposition: A | Discharge status: Home / Self Care | Payer: BLUE CROSS/BLUE SHIELD | Source: Ambulatory Visit | Attending: Emergency Medicine | Admitting: Emergency Medicine

## 2016-08-09 ENCOUNTER — Emergency Department
Admission: EM | Admit: 2016-08-09 | Discharge: 2016-08-09 | Disposition: A | Discharge status: Home / Self Care | Payer: BLUE CROSS/BLUE SHIELD | Attending: Emergency Medicine | Admitting: Emergency Medicine

## 2016-08-09 ENCOUNTER — Encounter: Payer: Self-pay | Admitting: Emergency Medicine

## 2016-08-09 DIAGNOSIS — S1091XA Abrasion of unspecified part of neck, initial encounter: Secondary | ICD-10-CM | POA: Diagnosis not present

## 2016-08-09 DIAGNOSIS — Z0441 Encounter for examination and observation following alleged adult rape: Secondary | ICD-10-CM | POA: Insufficient documentation

## 2016-08-09 DIAGNOSIS — Y929 Unspecified place or not applicable: Secondary | ICD-10-CM | POA: Insufficient documentation

## 2016-08-09 DIAGNOSIS — Z79899 Other long term (current) drug therapy: Secondary | ICD-10-CM | POA: Diagnosis not present

## 2016-08-09 DIAGNOSIS — X58XXXA Exposure to other specified factors, initial encounter: Secondary | ICD-10-CM | POA: Insufficient documentation

## 2016-08-09 DIAGNOSIS — T148XXA Other injury of unspecified body region, initial encounter: Secondary | ICD-10-CM

## 2016-08-09 DIAGNOSIS — Y999 Unspecified external cause status: Secondary | ICD-10-CM | POA: Insufficient documentation

## 2016-08-09 DIAGNOSIS — T7421XA Adult sexual abuse, confirmed, initial encounter: Secondary | ICD-10-CM | POA: Insufficient documentation

## 2016-08-09 DIAGNOSIS — Y939 Activity, unspecified: Secondary | ICD-10-CM | POA: Insufficient documentation
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
## 2016-08-09 NOTE — ED Notes (Signed)
EDP at bedside  
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
## 2016-08-22 ENCOUNTER — Encounter: Payer: Self-pay | Admitting: Emergency Medicine

## 2016-08-22 ENCOUNTER — Emergency Department
Admission: EM | Admit: 2016-08-22 | Discharge: 2016-08-23 | Disposition: A | Discharge status: Home / Self Care | Payer: BLUE CROSS/BLUE SHIELD | Attending: Emergency Medicine | Admitting: Emergency Medicine

## 2016-08-22 DIAGNOSIS — R45851 Suicidal ideations: Secondary | ICD-10-CM

## 2016-08-22 DIAGNOSIS — Z79899 Other long term (current) drug therapy: Secondary | ICD-10-CM | POA: Diagnosis not present

## 2016-08-22 DIAGNOSIS — F329 Major depressive disorder, single episode, unspecified: Secondary | ICD-10-CM | POA: Diagnosis not present

## 2016-08-22 DIAGNOSIS — F4310 Post-traumatic stress disorder, unspecified: Secondary | ICD-10-CM

## 2016-08-22 HISTORY — DX: Major depressive disorder, single episode, unspecified: F32.9

## 2016-08-22 HISTORY — DX: Depression, unspecified: F32.A

## 2016-08-22 LAB — CBC
HEMATOCRIT: 41.4 % (ref 35.0–47.0)
Hemoglobin: 14.1 g/dL (ref 12.0–16.0)
MCH: 27 pg (ref 26.0–34.0)
MCHC: 34 g/dL (ref 32.0–36.0)
MCV: 79.6 fL — AB (ref 80.0–100.0)
PLATELETS: 401 10*3/uL (ref 150–440)
RBC: 5.2 MIL/uL (ref 3.80–5.20)
RDW: 13.5 % (ref 11.5–14.5)
WBC: 10 10*3/uL (ref 3.6–11.0)

## 2016-08-22 LAB — COMPREHENSIVE METABOLIC PANEL
ALT: 20 U/L (ref 14–54)
AST: 20 U/L (ref 15–41)
Albumin: 4.5 g/dL (ref 3.5–5.0)
Alkaline Phosphatase: 70 U/L (ref 38–126)
Anion gap: 8 (ref 5–15)
BUN: 16 mg/dL (ref 6–20)
CHLORIDE: 105 mmol/L (ref 101–111)
CO2: 27 mmol/L (ref 22–32)
Calcium: 9.8 mg/dL (ref 8.9–10.3)
Creatinine, Ser: 0.88 mg/dL (ref 0.44–1.00)
Glucose, Bld: 113 mg/dL — ABNORMAL HIGH (ref 65–99)
POTASSIUM: 3.7 mmol/L (ref 3.5–5.1)
Sodium: 140 mmol/L (ref 135–145)
TOTAL PROTEIN: 7.8 g/dL (ref 6.5–8.1)
Total Bilirubin: 0.3 mg/dL (ref 0.3–1.2)

## 2016-08-22 LAB — POCT PREGNANCY, URINE: Preg Test, Ur: NEGATIVE
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
## 2016-08-23 LAB — URINALYSIS COMPLETE WITH MICROSCOPIC (ARMC ONLY)
BILIRUBIN URINE: NEGATIVE
GLUCOSE, UA: NEGATIVE mg/dL
HGB URINE DIPSTICK: NEGATIVE
Ketones, ur: NEGATIVE mg/dL
LEUKOCYTES UA: NEGATIVE
Nitrite: NEGATIVE
PH: 8 (ref 5.0–8.0)
Protein, ur: NEGATIVE mg/dL
SPECIFIC GRAVITY, URINE: 1.018 (ref 1.005–1.030)

## 2016-08-23 LAB — URINE DRUG SCREEN, QUALITATIVE (ARMC ONLY)
Amphetamines, Ur Screen: NOT DETECTED
BENZODIAZEPINE, UR SCRN: NOT DETECTED
Barbiturates, Ur Screen: NOT DETECTED
Cannabinoid 50 Ng, Ur ~~LOC~~: NOT DETECTED
Cocaine Metabolite,Ur ~~LOC~~: NOT DETECTED
MDMA (ECSTASY) UR SCREEN: NOT DETECTED
METHADONE SCREEN, URINE: NOT DETECTED
OPIATE, UR SCREEN: NOT DETECTED
PHENCYCLIDINE (PCP) UR S: NOT DETECTED
Tricyclic, Ur Screen: NOT DETECTED

## 2016-08-23 LAB — ACETAMINOPHEN LEVEL: Acetaminophen (Tylenol), Serum: <10 ug/mL — ABNORMAL LOW (ref 10–30)

## 2016-08-23 LAB — ETHANOL: Alcohol, Ethyl (B): <5 mg/dL (ref <5)

## 2016-08-23 LAB — SALICYLATE LEVEL

## 2016-08-23 NOTE — ED Notes (Signed)
Pt walked out by EDT, Lelon MastSamantha.

## 2016-08-23 NOTE — ED Provider Notes (Signed)
[facility] Emergency Department Provider Note   First MD Initiated Contact with Patient 08/22/16 2327     (approximate)  I have reviewed the triage vital signs and the nursing notes.   HISTORY  Chief Complaint Mental Health Problem   HPI Christine Blevins is a 20 y.o. female with history depression, PTSD presents to the emergency department with suicidal ideation with the patient states she's had for "years". Patient denies any specific plans. In addition patient admits to not sleeping for the past 3 days. Patient states that today she attempted to take a nap which lasted approximately 30 minutes. This is despite the fact that the patient is taking trazodone for sleep.   Past Medical History:  Diagnosis Date  . Depression     There are no active problems to display for this patient.   Past Surgical History:  Procedure Laterality Date  . ANKLE SURGERY      Prior to Admission medications   Medication Sig Start Date End Date Taking? Authorizing Provider  metroNIDAZOLE (FLAGYL) 500 MG tablet Take 1 tablet by mouth 2 (two) times daily. 12/15/15   Historical Provider, MD  ondansetron (ZOFRAN ODT) 4 MG disintegrating tablet Take 1 tablet (4 mg total) by mouth every 8 (eight) hours as needed for nausea or vomiting. 12/27/15   Rebecka ApleyAllison P Webster, MD  OXcarbazepine (TRILEPTAL) 150 MG tablet Take 1 tablet by mouth daily. 09/21/15   Historical Provider, MD  traZODone (DESYREL) 100 MG tablet Take 200 mg by mouth at bedtime. 09/20/15   Historical Provider, MD  venlafaxine XR (EFFEXOR-XR) 150 MG 24 hr capsule Take 150 mg by mouth daily. 09/20/15   Historical Provider, MD  venlafaxine XR (EFFEXOR-XR) 37.5 MG 24 hr capsule Take 37.5 mg by mouth daily. 09/20/15   Historical Provider, MD    Allergies Review of patient's allergies indicates no known allergies.  No family history on file.  Social History Social History  Substance Use Topics  . Smoking status:  Never Smoker  . Smokeless tobacco: Never Used  . Alcohol use Yes     Comment: social drinker, drinks liquor    Review of Systems Constitutional: No fever/chills Eyes: No visual changes. ENT: No sore throat. Cardiovascular: Denies chest pain. Respiratory: Denies shortness of breath. Gastrointestinal: No abdominal pain.  No nausea, no vomiting.  No diarrhea.  No constipation. Genitourinary: Negative for dysuria. Musculoskeletal: Negative for back pain. Skin: Negative for rash. Neurological: Negative for headaches, focal weakness or numbness. Psychiatric:Positive for suicidal ideation and depression. Positive for insomnia  10-point ROS otherwise negative.  ____________________________________________   PHYSICAL EXAM:  VITAL SIGNS: ED Triage Vitals [08/22/16 2253]  Enc Vitals Group     BP (!) 133/99     Pulse Rate 91     Resp 18     Temp 98.7 F (37.1 C)     Temp Source Oral     SpO2 100 %     Weight 135 lb (61.2 kg)     Height 5\' 2"  (1.575 m)     Head Circumference      Peak Flow      Pain Score      Pain Loc      Pain Edu?      Excl. in GC?     Constitutional: Alert and oriented. Well appearing and in no acute distress. Eyes: Conjunctivae are normal. PERRL. EOMI. Head: Atraumatic.  Mouth/Throat: Mucous membranes are moist.  Oropharynx non-erythematous. Neck: No stridor.  No  cervical spine tenderness to palpation. Cardiovascular: Normal rate, regular rhythm. Good peripheral circulation. Grossly normal heart sounds. Respiratory: Normal respiratory effort.  No retractions. Lungs CTAB. Gastrointestinal: Soft and nontender. No distention.  Musculoskeletal: No lower extremity tenderness nor edema. No gross deformities of extremities. Neurologic:  Normal speech and language. No gross focal neurologic deficits are appreciated.  Skin:  Skin is warm, dry and intact. No rash noted. Psychiatric: Mood and affect are normal. Speech and behavior are  normal.  ____________________________________________   LABS (all labs ordered are listed, but only abnormal results are displayed)  Labs Reviewed  COMPREHENSIVE METABOLIC PANEL - Abnormal; Notable for the following:       Result Value   Glucose, Bld 113 (*)    All other components within normal limits  CBC - Abnormal; Notable for the following:    MCV 79.6 (*)    All other components within normal limits  ETHANOL  SALICYLATE LEVEL  ACETAMINOPHEN LEVEL  URINE DRUG SCREEN, QUALITATIVE ([facility] ONLY)  URINALYSIS COMPLETEWITH MICROSCOPIC ([facility] ONLY)  POCT PREGNANCY, URINE     Procedures    INITIAL IMPRESSION / ASSESSMENT AND PLAN / ED COURSE  Pertinent labs & imaging results that were available during my care of the patient were reviewed by me and considered in my medical decision making (see chart for details).  Patient evaluated by Dr. Sherlon Handingodriguez psychiatrist specialist on call who recommended that the patient be discharged home with outpatient follow-up. Patient denies suicidal homicidal ideation at this time. Patient very goal-directed stating that she wanted to go to work today and that she has "score to do as well.   Clinical Course    ____________________________________________  FINAL CLINICAL IMPRESSION(S) / ED DIAGNOSES  Final diagnoses:  PTSD (post-traumatic stress disorder)  Suicidal ideation     MEDICATIONS GIVEN DURING THIS VISIT:  Medications - No data to display   NEW OUTPATIENT MEDICATIONS STARTED DURING THIS VISIT:  New Prescriptions   No medications on file    Modified Medications   No medications on file    Discontinued Medications   No medications on file     Note:  This document was prepared using Dragon voice recognition software and may include unintentional dictation errors.    Darci Currentandolph N Dominyck Reser, MD 08/23/16 [phone]

## 2016-08-23 NOTE — ED Notes (Signed)
TTS, Registration and EDT at bedside.

## 2016-08-23 NOTE — ED Notes (Signed)
SOC computer at bedside, pt and monitor in sight of EDT sitter.

## 2016-09-19 ENCOUNTER — Emergency Department
Admission: EM | Admit: 2016-09-19 | Discharge: 2016-09-19 | Disposition: A | Discharge status: Home / Self Care | Payer: BLUE CROSS/BLUE SHIELD | Attending: Emergency Medicine | Admitting: Emergency Medicine

## 2016-09-19 ENCOUNTER — Ambulatory Visit (HOSPITAL_COMMUNITY)
Admission: EM | Admit: 2016-09-19 | Discharge: 2016-09-19 | Disposition: A | Discharge status: Home / Self Care | Payer: BLUE CROSS/BLUE SHIELD | Source: Ambulatory Visit | Attending: Emergency Medicine | Admitting: Emergency Medicine

## 2016-09-19 ENCOUNTER — Encounter: Payer: Self-pay | Admitting: *Deleted

## 2016-09-19 DIAGNOSIS — Z79899 Other long term (current) drug therapy: Secondary | ICD-10-CM | POA: Insufficient documentation

## 2016-09-19 DIAGNOSIS — S299XXA Unspecified injury of thorax, initial encounter: Secondary | ICD-10-CM | POA: Diagnosis present

## 2016-09-19 DIAGNOSIS — Y929 Unspecified place or not applicable: Secondary | ICD-10-CM | POA: Diagnosis not present

## 2016-09-19 DIAGNOSIS — S40022A Contusion of left upper arm, initial encounter: Secondary | ICD-10-CM | POA: Diagnosis not present

## 2016-09-19 DIAGNOSIS — Z0441 Encounter for examination and observation following alleged adult rape: Secondary | ICD-10-CM | POA: Diagnosis present

## 2016-09-19 DIAGNOSIS — S20211A Contusion of right front wall of thorax, initial encounter: Secondary | ICD-10-CM | POA: Diagnosis not present

## 2016-09-19 DIAGNOSIS — Y9389 Activity, other specified: Secondary | ICD-10-CM | POA: Diagnosis not present

## 2016-09-19 DIAGNOSIS — T07XXXA Unspecified multiple injuries, initial encounter: Secondary | ICD-10-CM

## 2016-09-19 DIAGNOSIS — S40021A Contusion of right upper arm, initial encounter: Secondary | ICD-10-CM | POA: Insufficient documentation

## 2016-09-19 DIAGNOSIS — Y999 Unspecified external cause status: Secondary | ICD-10-CM | POA: Diagnosis not present

## 2016-09-19 DIAGNOSIS — T7421XA Adult sexual abuse, confirmed, initial encounter: Secondary | ICD-10-CM | POA: Diagnosis not present
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
## 2016-09-19 NOTE — ED Notes (Signed)
SANE RN to assess
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
